# Patient Record
Sex: Male | Born: 1965 | Race: Black or African American | Hispanic: No | Marital: Single | Smoking: Current some day smoker
Health system: Southern US, Community
[De-identification: ages and names within clinical notes are randomized; demographics above are authoritative.]

## PROBLEM LIST (undated history)

## (undated) HISTORY — PX: APPENDECTOMY: SHX54

---

## 2013-04-14 ENCOUNTER — Encounter (HOSPITAL_BASED_OUTPATIENT_CLINIC_OR_DEPARTMENT_OTHER): Payer: Self-pay | Admitting: Emergency Medicine

## 2013-04-14 ENCOUNTER — Emergency Department (HOSPITAL_BASED_OUTPATIENT_CLINIC_OR_DEPARTMENT_OTHER)
Admission: EM | Admit: 2013-04-14 | Discharge: 2013-04-14 | Disposition: A | Discharge status: Home / Self Care | Payer: Self-pay | Attending: Emergency Medicine | Admitting: Emergency Medicine

## 2013-04-14 ENCOUNTER — Emergency Department (HOSPITAL_BASED_OUTPATIENT_CLINIC_OR_DEPARTMENT_OTHER): Payer: Self-pay

## 2013-04-14 DIAGNOSIS — X500XXA Overexertion from strenuous movement or load, initial encounter: Secondary | ICD-10-CM | POA: Insufficient documentation

## 2013-04-14 DIAGNOSIS — S6390XA Sprain of unspecified part of unspecified wrist and hand, initial encounter: Secondary | ICD-10-CM | POA: Insufficient documentation

## 2013-04-14 DIAGNOSIS — Y9389 Activity, other specified: Secondary | ICD-10-CM | POA: Insufficient documentation

## 2013-04-14 DIAGNOSIS — S63619A Unspecified sprain of unspecified finger, initial encounter: Secondary | ICD-10-CM

## 2013-04-14 DIAGNOSIS — Y929 Unspecified place or not applicable: Secondary | ICD-10-CM | POA: Insufficient documentation

## 2013-04-14 MED ORDER — IBUPROFEN 800 MG PO TABS
ORAL_TABLET | ORAL | Status: AC
  Filled 2013-04-14: qty 1

## 2013-04-14 MED ORDER — IBUPROFEN 800 MG PO TABS
800.0000 mg | ORAL_TABLET | Freq: Once | ORAL | Status: AC
  Administered 2013-04-14: 800 mg via ORAL

## 2013-04-14 NOTE — ED Notes (Signed)
Patient injured R index finger yesterday while riding a horse

## 2013-04-14 NOTE — Discharge Instructions (Signed)
Finger Sprain  A finger sprain happens when the bands of tissue that hold the finger bones together (ligaments) stretch too much and tear.  HOME CARE  · Keep your injured finger raised (elevated) when possible.  · Put ice on the injured area, twice a day, for 2 to 3 days.  · Put ice in a plastic bag.  · Place a towel between your skin and the bag.  · Leave the ice on for 15 minutes.  · Only take medicine as told by your doctor.  · Do not wear rings on the injured finger.  · Protect your finger until pain and stiffness go away (usually 3 to 4 weeks).  · Do not get your cast or splint to get wet. Cover your cast or splint with a plastic bag when you shower or bathe. Do not swim.  · Your doctor may suggest special exercises for you to do. These exercises will help keep or stop stiffness from happening.  GET HELP RIGHT AWAY IF:  · Your cast or splint gets damaged.  · Your pain gets worse, not better.  MAKE SURE YOU:  · Understand these instructions.  · Will watch your condition.  · Will get help right away if you are not doing well or get worse.  Document Released: 01/29/2010 Document Revised: 03/21/2011 Document Reviewed: 08/30/2010  [employer]® Patient Information ©2014 [employer].

## 2013-04-14 NOTE — ED Provider Notes (Addendum)
CSN: [id number]     Arrival date & time 04/14/13  0907 History   First MD Initiated Contact with Patient 04/14/13 0912     No chief complaint on file.    (Consider location/radiation/quality/duration/timing/severity/associated sxs/prior Treatment) Patient is a 48 y.o. male presenting with hand pain. The history is provided by the patient.  Hand Pain This is a new (he rides horses and pulled back on the reigns and felt a pop and pain in his finger) problem. The current episode started yesterday. The problem occurs constantly. The problem has not changed since onset.Associated symptoms comments: Pain and swelling to the right index finger. The symptoms are aggravated by bending. Nothing relieves the symptoms. He has tried rest (ice) for the symptoms. The treatment provided no relief.    No past medical history on file. No past surgical history on file. No family history on file. History  Substance Use Topics  . Smoking status: Not on file  . Smokeless tobacco: Not on file  . Alcohol Use: Not on file    Review of Systems  All other systems reviewed and are negative.      Allergies  Review of patient's allergies indicates not on file.  Home Medications  No current outpatient prescriptions on file. There were no vitals taken for this visit. Physical Exam  Nursing note and vitals reviewed. Constitutional: He is oriented to person, place, and time. He appears well-developed and well-nourished. No distress.  HENT:  Head: Normocephalic and atraumatic.  Cardiovascular: Normal rate.   Pulmonary/Chest: Effort normal.  Musculoskeletal:       Right wrist: Normal.       Hands: Neurological: He is alert and oriented to person, place, and time.  Skin: Skin is warm and dry. No rash noted. No erythema.  Psychiatric: He has a normal mood and affect. His behavior is normal.    ED Course  Procedures (including critical care time) Labs Review Labs Reviewed - No data to display Imaging  Review Dg Finger Index Right  04/14/2013   CLINICAL DATA:  Traumatic injury and pain  EXAM: RIGHT INDEX FINGER 2+V  COMPARISON:  None.  FINDINGS: There is no evidence of fracture or dislocation. There is no evidence of arthropathy or other focal bone abnormality. Soft tissues are unremarkable.  IMPRESSION: No acute abnormality noted.   Electronically Signed   By: Alcide CleverMark  Lukens M.D.   On: 04/14/2013 09:52     EKG Interpretation None      MDM   Final diagnoses:  Finger sprain    Patient was riding his horse yesterday and pulled back on the riegns and felt a significant pain and pop in his second MCP joint. Since that time he's had persistent pain and swelling. O/w no wrist, thumb or elbow pain.  Normal tendon function and sensation. Plain film pending.  10:05 AM Imaging neg and pt placed in finger splint  Gwyneth SproutWhitney Usha Slager, MD 04/14/13 1005  Gwyneth SproutWhitney Margarita Bobrowski, MD 04/14/13 1006

## 2020-03-30 ENCOUNTER — Encounter (HOSPITAL_BASED_OUTPATIENT_CLINIC_OR_DEPARTMENT_OTHER): Payer: Self-pay

## 2020-03-30 ENCOUNTER — Other Ambulatory Visit: Payer: Self-pay

## 2020-03-30 ENCOUNTER — Emergency Department (HOSPITAL_BASED_OUTPATIENT_CLINIC_OR_DEPARTMENT_OTHER)
Admission: EM | Admit: 2020-03-30 | Discharge: 2020-03-30 | Disposition: A | Discharge status: Home / Self Care | Payer: 59 | Attending: Emergency Medicine | Admitting: Emergency Medicine

## 2020-03-30 ENCOUNTER — Emergency Department (HOSPITAL_BASED_OUTPATIENT_CLINIC_OR_DEPARTMENT_OTHER): Payer: 59

## 2020-03-30 DIAGNOSIS — M545 Low back pain, unspecified: Secondary | ICD-10-CM | POA: Diagnosis not present

## 2020-03-30 DIAGNOSIS — F17290 Nicotine dependence, other tobacco product, uncomplicated: Secondary | ICD-10-CM | POA: Diagnosis not present

## 2020-03-30 DIAGNOSIS — M549 Dorsalgia, unspecified: Secondary | ICD-10-CM | POA: Diagnosis present

## 2020-03-30 LAB — URINALYSIS, ROUTINE W REFLEX MICROSCOPIC
Bilirubin Urine: NEGATIVE
Glucose, UA: NEGATIVE mg/dL
Hgb urine dipstick: NEGATIVE
Ketones, ur: NEGATIVE mg/dL
Leukocytes,Ua: NEGATIVE
Nitrite: NEGATIVE
Protein, ur: NEGATIVE mg/dL
Specific Gravity, Urine: 1.025 (ref 1.005–1.030)
pH: 6 (ref 5.0–8.0)

## 2020-03-30 MED ORDER — IBUPROFEN 800 MG PO TABS: 800.0000 mg | ORAL_TABLET | Freq: Three times a day (TID) | ORAL | 0 refills | Status: DC

## 2020-03-30 MED ORDER — METHOCARBAMOL 500 MG PO TABS: 500.0000 mg | ORAL_TABLET | Freq: Two times a day (BID) | ORAL | 0 refills | Status: AC

## 2020-03-30 MED ORDER — KETOROLAC TROMETHAMINE 60 MG/2ML IM SOLN
60.0000 mg | Freq: Once | INTRAMUSCULAR | Status: AC
  Administered 2020-03-30: 60 mg via INTRAMUSCULAR
  Filled 2020-03-30: qty 2

## 2020-03-30 NOTE — Discharge Instructions (Signed)
Your back pain should be treated with medicines such as ibuprofen or aleve and this back pain should get better over the next 2 weeks. However if you develop severe or worsening pain, low back pain with fever, numbness, weakness or inability to walk or urinate, you should return to the ER immediately.  Please follow up with your doctor this week for a recheck if still having symptoms. Low back pain is discomfort in the lower back that may be due to injuries to muscles and ligaments around the spine.  Occasionally, it may be caused by a a problem to a part of the spine called a disc.  The pain may last several days or a week;  However, most patients get completely well in 4 weeks.  Please take the prescribed medicines as directed, take the muscle racks at night, do not drink or drive the medication.  You may also use the low back stretches provided in your discharge paperwork.  Please call the phone number on your discharge paperwork in order to establish with a primary care provider, you may require further treatment such as physical therapy.  Please return to the ER if you have sudden loss of bowel or bladder control, numbness, weakness, or any other new or concerning symptoms.

## 2020-03-30 NOTE — ED Provider Notes (Signed)
[facility] EMERGENCY DEPARTMENT Provider Note   CSN: [id number] Arrival date & time: 03/30/20  1135     History Chief Complaint  Patient presents with  . Back Pain    Christopher Mckay is a 55 y.o. male.  HPI 55 year old male with no significant medical history [place] to the ER with several days of back pain.  No inciting incident.  No falls.  States that this feels different than a "muscle strain".  He denies any numbness or tingling, no foot drop, loss of bowel bladder control.  No chest pain or shortness of breath, no dizziness.  He has not taken anything for pain.  Denies any dysuria, hematuria.  No history of kidney stones.  He was seen at [facility] yesterday, had a UA done, was given a course of prednisone but he did not fill this.  Denies any changes in his symptoms.    History reviewed. No pertinent past medical history.  There are no problems to display for this patient.   Past Surgical History:  Procedure Laterality Date  . APPENDECTOMY         No family history on file.  Social History   Tobacco Use  . Smoking status: Current Some Day Smoker    Types: Cigars  . Smokeless tobacco: Never Used  Substance Use Topics  . Alcohol use: Yes  . Drug use: Yes    Types: Marijuana    Home Medications Prior to Admission medications   Medication Sig Start Date End Date Taking? Authorizing Provider  ibuprofen (ADVIL) 800 MG tablet Take 1 tablet (800 mg total) by mouth 3 (three) times daily. 03/30/20  Yes Mare Ferrari, PA-C  methocarbamol (ROBAXIN) 500 MG tablet Take 1 tablet (500 mg total) by mouth 2 (two) times daily. 03/30/20  Yes Mare Ferrari, PA-C    Allergies    Patient has no known allergies.  Review of Systems   Review of Systems  Constitutional: Negative for fever.  Respiratory: Negative for shortness of breath.   Cardiovascular: Negative for chest pain and palpitations.  Gastrointestinal: Negative for vomiting.  Genitourinary:  Negative for dysuria and hematuria.  Musculoskeletal: Positive for back pain. Negative for arthralgias.  Skin: Negative for color change and rash.  Neurological: Negative for seizures, syncope and weakness.  All other systems reviewed and are negative.   Physical Exam Updated Vital Signs BP 129/79 (BP Location: Right Arm)   Pulse 85   Temp 98.1 F (36.7 C) (Oral)   Resp 16   Ht 5\' 6"  (1.676 m)   Wt 69.4 kg   SpO2 99%   BMI 24.69 kg/m   Physical Exam Musculoskeletal:       Back:     Comments: Associated paraspinal muscle tenderness to the lumbar spine.  No overlying skin changes.  No step-offs or crepitus.  No midline tenderness of the C, T, L-spine.  5/5 strength in upper lower extremities bilaterally.  Patient ambulated in the ER without difficulty.     ED Results / Procedures / Treatments   Labs (all labs ordered are listed, but only abnormal results are displayed) Labs Reviewed  URINALYSIS, ROUTINE W REFLEX MICROSCOPIC    EKG None  Radiology DG Lumbar Spine Complete  Result Date: 03/30/2020 CLINICAL DATA:  Back pain without trauma EXAM: LUMBAR SPINE - COMPLETE 4+ VIEW COMPARISON:  04/22/2017 from [facility] FINDINGS: Five lumbar type vertebral bodies. Sacroiliac joints are symmetric. 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 IMPRESSION: Age advanced spondylosis, without acute osseous abnormality. Electronically Signed   By: Jeronimo Greaves M.D.   On: 03/30/2020 13:49    Procedures Procedures   Medications Ordered in ED Medications  ketorolac (TORADOL) injection 60 mg (60 mg Intramuscular Given 03/30/20 1318)    ED Course  I have reviewed the triage vital signs and the nursing notes.  Pertinent labs & imaging results that were available during my care of the patient were reviewed by me and considered in my medical decision making (see chart for details).    MDM  Rules/Calculators/A&P                          Patient with back pain.  No neurological deficits and normal neuro exam.  Patient can walk but states is painful.  No loss of bowel or bladder control.  No concern for cauda equina.  No fever, night sweats, weight loss, h/o cancer, IVDU.  Plain films with angiogram spondylolysis, however no acute changes.  UA without evidence of UTI.  RICE protocol and pain medicine indicated and discussed with patient.  Patient was encouraged to establish with a primary care doctor, return precautions discussed.  He was understanding and is agreeable.  Stable for discharge.  Final Clinical Impression(s) / ED Diagnoses Final diagnoses:  Acute right-sided low back pain without sciatica    Rx / DC Orders ED Discharge Orders         Ordered    ibuprofen (ADVIL) 800 MG tablet  3 times daily        03/30/20 1457    methocarbamol (ROBAXIN) 500 MG tablet  2 times daily        03/30/20 1457           Leone Brand 03/30/20 1520    Terald Sleeper, MD 03/30/20 1758

## 2020-03-30 NOTE — ED Triage Notes (Signed)
Pt arrives with c/o back pain starting this Saturday. Points to lower right back, denies urinary symptoms, denies any pain the legs.

## 2022-07-13 ENCOUNTER — Emergency Department (HOSPITAL_BASED_OUTPATIENT_CLINIC_OR_DEPARTMENT_OTHER)
Admission: EM | Admit: 2022-07-13 | Discharge: 2022-07-13 | Disposition: A | Discharge status: Home / Self Care | Payer: Commercial Managed Care - HMO | Attending: Emergency Medicine | Admitting: Emergency Medicine

## 2022-07-13 ENCOUNTER — Encounter (HOSPITAL_BASED_OUTPATIENT_CLINIC_OR_DEPARTMENT_OTHER): Payer: Self-pay

## 2022-07-13 ENCOUNTER — Emergency Department (HOSPITAL_BASED_OUTPATIENT_CLINIC_OR_DEPARTMENT_OTHER): Payer: Commercial Managed Care - HMO

## 2022-07-13 ENCOUNTER — Other Ambulatory Visit: Payer: Self-pay

## 2022-07-13 DIAGNOSIS — M79645 Pain in left finger(s): Secondary | ICD-10-CM | POA: Insufficient documentation

## 2022-07-13 MED ORDER — CELECOXIB 200 MG PO CAPS: 200.0000 mg | ORAL_CAPSULE | Freq: Two times a day (BID) | ORAL | 0 refills | Status: AC

## 2022-07-13 NOTE — ED Triage Notes (Signed)
States injured his left hand a week ago, can't remember mechanism. C/o pain and swelling to base of thumb.

## 2022-07-13 NOTE — ED Provider Notes (Signed)
[place] EMERGENCY DEPARTMENT AT [facility] Provider Note   CSN: [id number] Arrival date & time: 07/13/22  1047     History  Chief Complaint  Patient presents with   Hand Pain    Christopher Mckay is a 57 y.o. male who presents emergency department with chief complaint of left thumb pain.  Patient states that he was working in the yard a week ago when he felt something painful occur in the left hand.  He is not sure exactly what he did but noted that it was immediately painful and began swelling later that evening.  He is right-hand dominant.  He complains of worsening pain over the last 24 hours in the left thenar eminence.  He states that he is unable to hold things in his left hand because his grip strength is diminished due to the pain in his thenar eminence.  He denies numbness or tingling, history of gout, fever or chills.  Has not tried any medications or intervening supportive care at home prior to assessment.  The history is provided by the patient.  Hand Pain       Home Medications Prior to Admission medications   Medication Sig Start Date End Date Taking? Authorizing Provider  celecoxib (CELEBREX) 200 MG capsule Take 1 capsule (200 mg total) by mouth 2 (two) times daily. 07/13/22  Yes Olivianna Higley, PA-C  methocarbamol (ROBAXIN) 500 MG tablet Take 1 tablet (500 mg total) by mouth 2 (two) times daily. 03/30/20   Mare Ferrari, PA-C      Allergies    Patient has no known allergies.    Review of Systems   Review of Systems  Physical Exam Updated Vital Signs BP (!) 149/115 (BP Location: Right Arm)   Pulse 60   Temp 98 F (36.7 C) (Oral)   Resp 16   Ht 5\' 6"  (1.676 m)   Wt 69.4 kg   SpO2 98%   BMI 24.69 kg/m  Physical Exam Vitals and nursing note reviewed.  Constitutional:      General: He is not in acute distress.    Appearance: He is well-developed. He is not diaphoretic.  HENT:     Head: Normocephalic and atraumatic.  Eyes:     General: No  scleral icterus.    Conjunctiva/sclera: Conjunctivae normal.  Cardiovascular:     Rate and Rhythm: Normal rate and regular rhythm.     Heart sounds: Normal heart sounds.  Pulmonary:     Effort: Pulmonary effort is normal. No respiratory distress.     Breath sounds: Normal breath sounds.  Abdominal:     Palpations: Abdomen is soft.     Tenderness: There is no abdominal tenderness.  Musculoskeletal:     Cervical back: Normal range of motion and neck supple.     Comments: Left thenar eminence is swollen as compared to the right.  It is tender to palpation.  He has limited range of motion due to stiffness pain and swelling however is able to abduct extend and abduct the thumb  Skin:    General: Skin is warm and dry.  Neurological:     Mental Status: He is alert.  Psychiatric:        Behavior: Behavior normal.     ED Results / Procedures / Treatments   Labs (all labs ordered are listed, but only abnormal results are displayed) Labs Reviewed - No data to display  EKG None  Radiology DG Hand Complete Left  Result Date: 07/13/2022  CLINICAL DATA:  Pain and swelling to the base of the left thumb. EXAM: LEFT HAND - COMPLETE 3+ VIEW COMPARISON:  None Available. FINDINGS: No definite acute fracture or dislocation. There is joint space narrowing and osteophyte formation at the first [facility] joint. Regional soft tissues are unremarkable. IMPRESSION: No definite acute fracture or dislocation. Moderate/severe degenerative changes at the first [facility] joint. Electronically Signed   By: Emmaline Kluver M.D.   On: 07/13/2022 11:36    Procedures Procedures    Medications Ordered in ED Medications - No data to display  ED Course/ Medical Decision Making/ A&P                             Medical Decision Making Amount and/or Complexity of Data Reviewed Radiology: ordered.  Risk Prescription drug management.  Patient here with left thumb pain.  He had an injury to the area.  Differential  diagnosis includes arthritis, tendinitis, tenosynovitis, less likely crystalopathy. Patient will be treated with symptomatic treatment with anti-inflammatories, ice, immobilization.  Given referral to hand Ortho for follow-up should conservative treatment fail.  He is comfortable with the plan.  He has noted to have elevated blood pressure.  Discussed that he should follow-up in the outpatient setting for this.  Appears otherwise appropriate for discharge.        Final Clinical Impression(s) / ED Diagnoses Final diagnoses:  Thumb pain, left    Rx / DC Orders ED Discharge Orders          Ordered    celecoxib (CELEBREX) 200 MG capsule  2 times daily        07/13/22 1156              Arthor Captain, PA-C 07/15/22 1610    Virgina Norfolk, DO 07/15/22 2118

## 2022-07-13 NOTE — ED Notes (Signed)
Discharge instructions reviewed with patient. Patient verbalizes understanding, no further questions at this time. Medications/prescriptions and follow up information provided. No acute distress noted at time of departure.  

## 2022-07-13 NOTE — Discharge Instructions (Addendum)
Contact a health care provider if: Your symptoms are not improving or are getting worse. You have a fever and worsening of any of the following symptoms: Pain. Redness. Warmth. Swelling. Get help right away if: Your fingers or toes become numb or turn blue. 

## 2022-09-16 IMAGING — DX DG LUMBAR SPINE COMPLETE 4+V
5 series · 5 of 5 positions shown · non-contrast
Comparison: 04/22/2017 from [REDACTED]

CLINICAL DATA: Back pain without trauma

EXAM:
LUMBAR SPINE - COMPLETE 4+ VIEW

[l-spine ap]
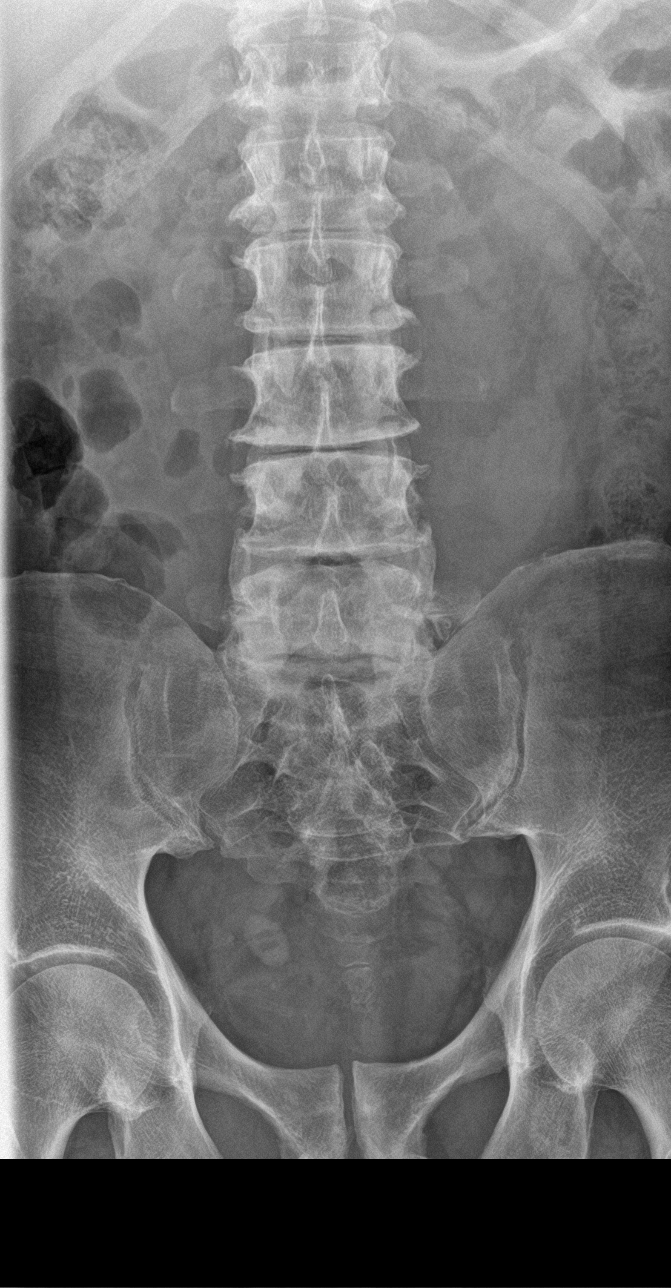

[l-spine obl (1 of 2)]
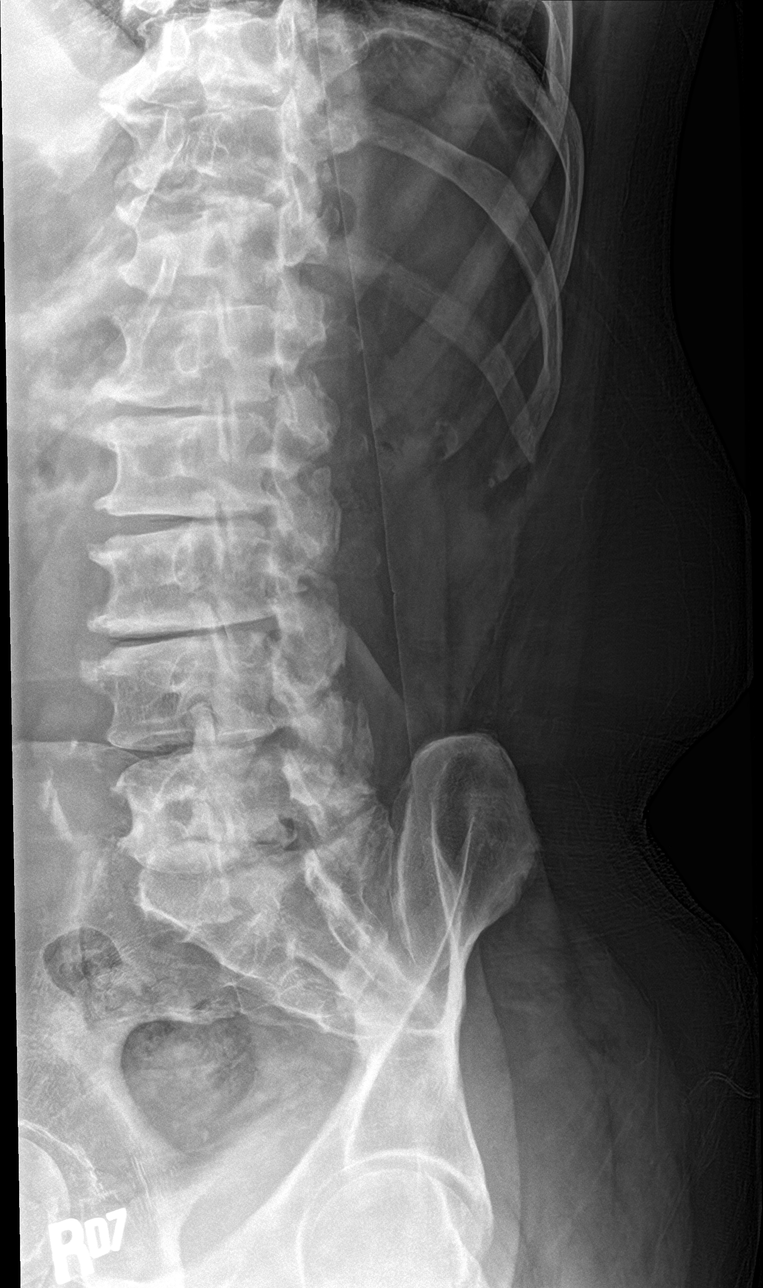

[l-spine lat]
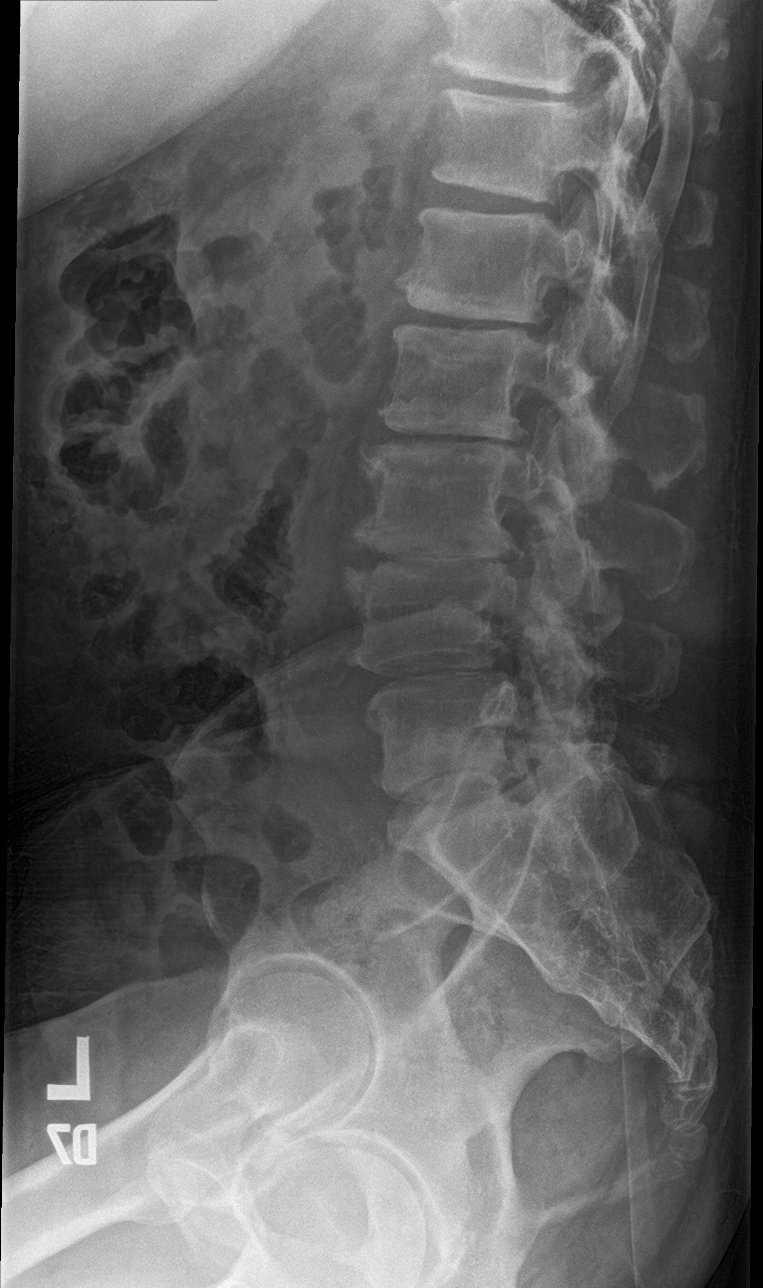

[l-spine obl (2 of 2)]
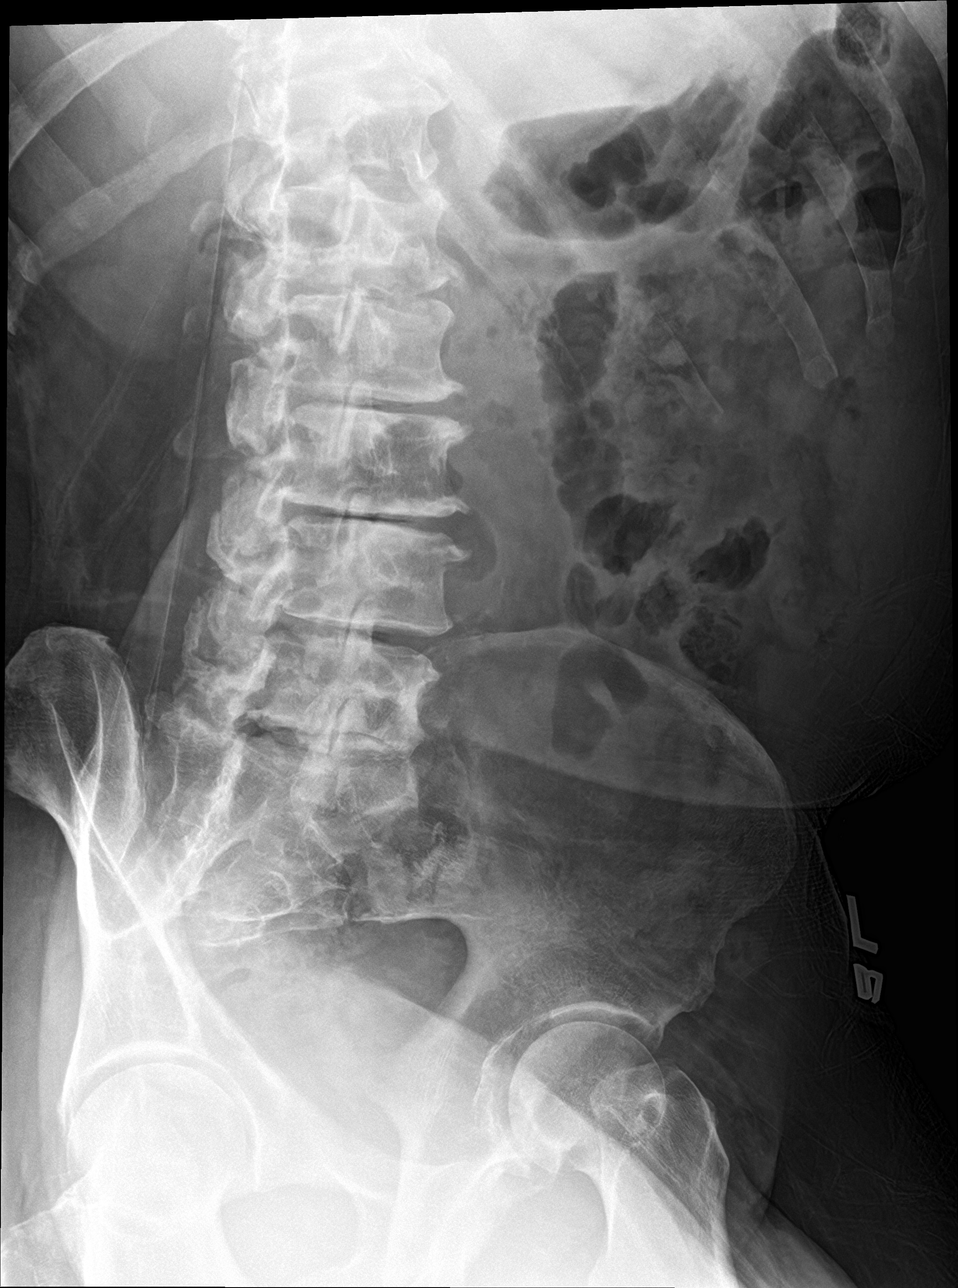

[l-spine spot]
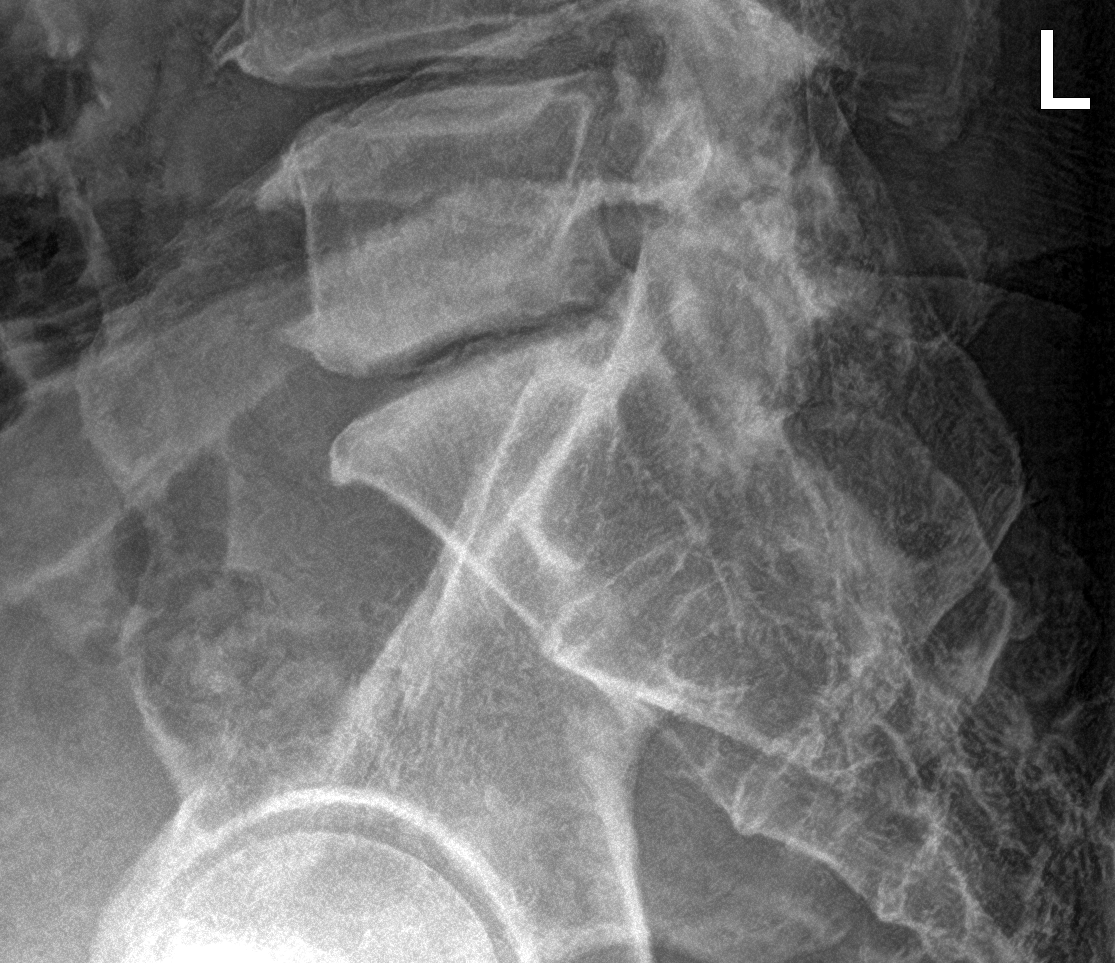

[5 of 5 positions shown; findings below may reference images not displayed]

FINDINGS: Five lumbar type vertebral bodies. Sacroiliac joints are symmetric.
Age advanced spondylosis, with degenerative disc disease and facet
arthropathy most significant at L3-4 through L5-S1.
IMPRESSION: Age advanced spondylosis, without acute osseous abnormality.
